# Patient Record
Sex: Male | Born: 1940 | Race: White | Hispanic: No | Marital: Single
Health system: Southern US, Community
[De-identification: ages and names within clinical notes are randomized; demographics above are authoritative.]

## PROBLEM LIST (undated history)

## (undated) DIAGNOSIS — M542 Cervicalgia: Secondary | ICD-10-CM

## (undated) DIAGNOSIS — J449 Chronic obstructive pulmonary disease, unspecified: Secondary | ICD-10-CM

## (undated) DIAGNOSIS — K449 Diaphragmatic hernia without obstruction or gangrene: Secondary | ICD-10-CM

## (undated) DIAGNOSIS — IMO0001 Reserved for inherently not codable concepts without codable children: Secondary | ICD-10-CM

## (undated) DIAGNOSIS — R519 Headache, unspecified: Secondary | ICD-10-CM

## (undated) DIAGNOSIS — E119 Type 2 diabetes mellitus without complications: Secondary | ICD-10-CM

## (undated) DIAGNOSIS — R011 Cardiac murmur, unspecified: Secondary | ICD-10-CM

## (undated) DIAGNOSIS — R0602 Shortness of breath: Secondary | ICD-10-CM

## (undated) DIAGNOSIS — R51 Headache: Secondary | ICD-10-CM

## (undated) DIAGNOSIS — G629 Polyneuropathy, unspecified: Secondary | ICD-10-CM

## (undated) DIAGNOSIS — R12 Heartburn: Secondary | ICD-10-CM

## (undated) DIAGNOSIS — Y249XXA Unspecified firearm discharge, undetermined intent, initial encounter: Secondary | ICD-10-CM

## (undated) DIAGNOSIS — G4730 Sleep apnea, unspecified: Secondary | ICD-10-CM

## (undated) DIAGNOSIS — I10 Essential (primary) hypertension: Secondary | ICD-10-CM

## (undated) DIAGNOSIS — D689 Coagulation defect, unspecified: Secondary | ICD-10-CM

## (undated) DIAGNOSIS — N400 Enlarged prostate without lower urinary tract symptoms: Secondary | ICD-10-CM

## (undated) DIAGNOSIS — I209 Angina pectoris, unspecified: Secondary | ICD-10-CM

## (undated) DIAGNOSIS — E039 Hypothyroidism, unspecified: Secondary | ICD-10-CM

## (undated) DIAGNOSIS — W3400XA Accidental discharge from unspecified firearms or gun, initial encounter: Secondary | ICD-10-CM

## (undated) DIAGNOSIS — M1990 Unspecified osteoarthritis, unspecified site: Secondary | ICD-10-CM

## (undated) DIAGNOSIS — K219 Gastro-esophageal reflux disease without esophagitis: Secondary | ICD-10-CM

## (undated) HISTORY — PX: COLON SURGERY: SHX602

## (undated) HISTORY — PX: CARDIAC CATHETERIZATION: SHX172

## (undated) HISTORY — PX: OTHER SURGICAL HISTORY: SHX169

## (undated) HISTORY — PX: HERNIA REPAIR: SHX51

---

## 2006-12-09 ENCOUNTER — Inpatient Hospital Stay: Payer: Self-pay | Admitting: *Deleted

## 2006-12-09 ENCOUNTER — Other Ambulatory Visit: Payer: Self-pay

## 2006-12-29 ENCOUNTER — Ambulatory Visit: Payer: Self-pay | Admitting: Anesthesiology

## 2007-01-27 ENCOUNTER — Ambulatory Visit: Payer: Self-pay | Admitting: Pain Medicine

## 2007-02-18 ENCOUNTER — Ambulatory Visit: Payer: Self-pay | Admitting: Pain Medicine

## 2007-03-09 ENCOUNTER — Ambulatory Visit: Payer: Self-pay | Admitting: Physician Assistant

## 2007-04-01 ENCOUNTER — Ambulatory Visit: Payer: Self-pay | Admitting: Pain Medicine

## 2007-04-15 ENCOUNTER — Ambulatory Visit: Payer: Self-pay | Admitting: Physician Assistant

## 2007-04-29 ENCOUNTER — Ambulatory Visit: Payer: Self-pay | Admitting: Pain Medicine

## 2007-05-04 ENCOUNTER — Ambulatory Visit: Payer: Self-pay | Admitting: Family Medicine

## 2007-05-15 ENCOUNTER — Inpatient Hospital Stay: Payer: Self-pay | Admitting: Surgery

## 2007-05-31 ENCOUNTER — Ambulatory Visit: Payer: Self-pay | Admitting: Physician Assistant

## 2007-06-30 ENCOUNTER — Ambulatory Visit: Payer: Self-pay | Admitting: Physician Assistant

## 2007-08-30 ENCOUNTER — Ambulatory Visit: Payer: Self-pay | Admitting: Physician Assistant

## 2007-09-09 ENCOUNTER — Ambulatory Visit: Payer: Self-pay | Admitting: Pain Medicine

## 2007-10-11 ENCOUNTER — Ambulatory Visit: Payer: Self-pay | Admitting: Physician Assistant

## 2007-11-08 ENCOUNTER — Ambulatory Visit: Payer: Self-pay | Admitting: Physician Assistant

## 2007-11-11 ENCOUNTER — Ambulatory Visit: Payer: Self-pay | Admitting: Pain Medicine

## 2007-11-25 ENCOUNTER — Ambulatory Visit: Payer: Self-pay | Admitting: Physician Assistant

## 2007-12-31 ENCOUNTER — Emergency Department: Payer: Self-pay | Admitting: Emergency Medicine

## 2008-05-03 ENCOUNTER — Ambulatory Visit: Payer: Self-pay | Admitting: Gastroenterology

## 2008-07-12 ENCOUNTER — Ambulatory Visit: Payer: Self-pay | Admitting: Urology

## 2008-07-25 ENCOUNTER — Ambulatory Visit: Payer: Self-pay | Admitting: Urology

## 2008-07-30 ENCOUNTER — Inpatient Hospital Stay: Payer: Self-pay | Admitting: Internal Medicine
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
## 2008-12-04 IMAGING — CR DG ABDOMEN 3V
1 series · 4 of 4 positions shown · non-contrast
Comparison: none

REASON FOR EXAM: abd pain, nausea, h/o multiple obstructions; [HOSPITAL]
COMMENTS:

PROCEDURE:     DXR - DXR ABDOMEN 3-WAY (INCL PA CXR)  - May 14, 2007  [DATE]
RESULT:     Comparison: Chest radiographs on 05/04/2007.

[Series 1: view not recorded · 0.17mm/px · 4 of 4 slices shown]
[im 1/4]
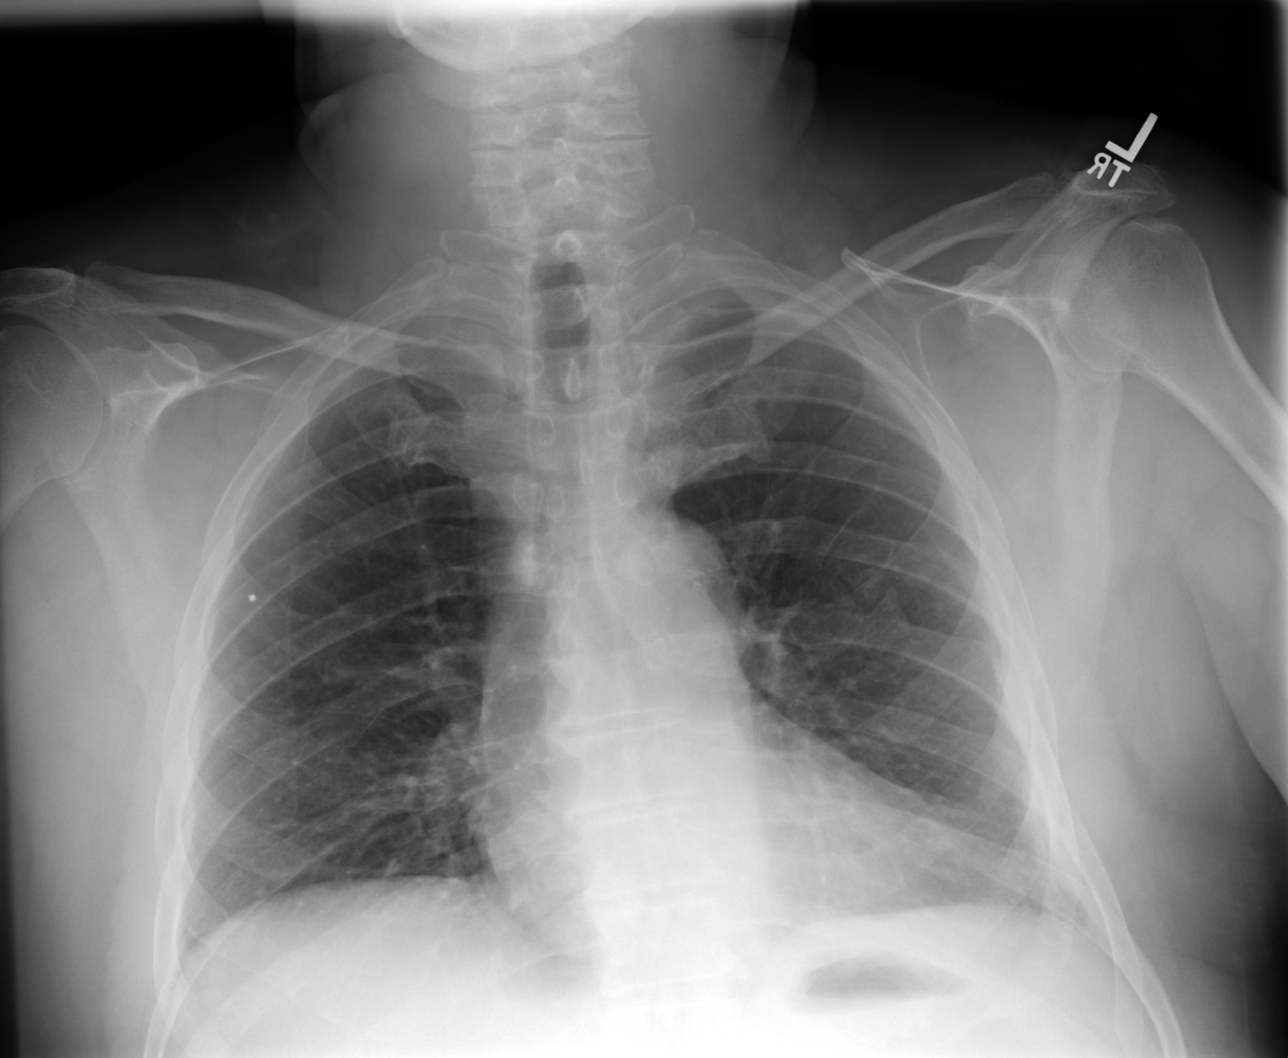
[im 2/4]
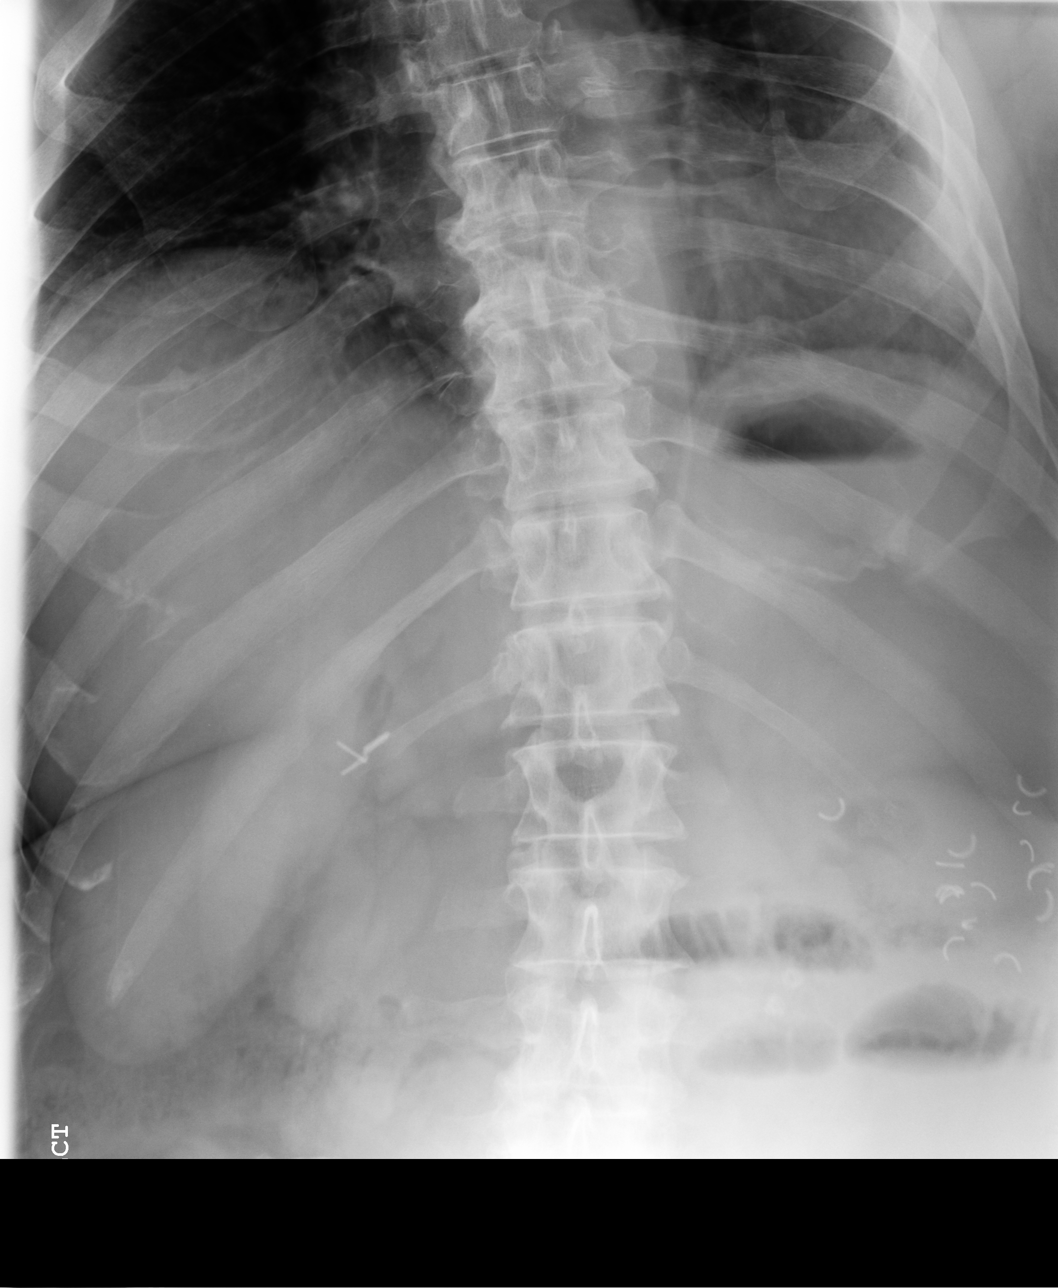
[im 3/4]
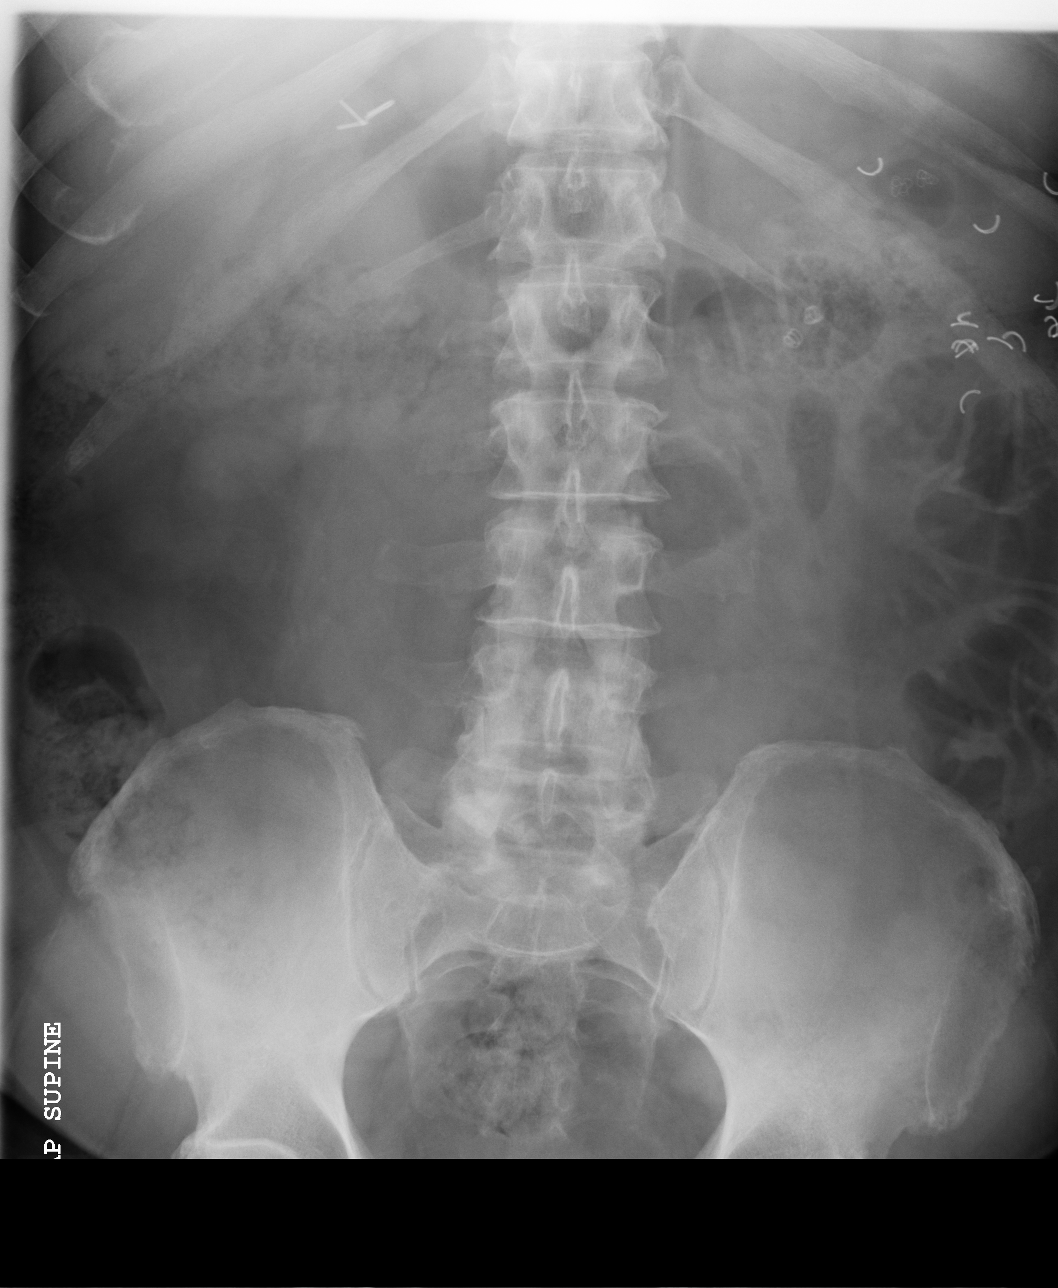
[im 4/4]
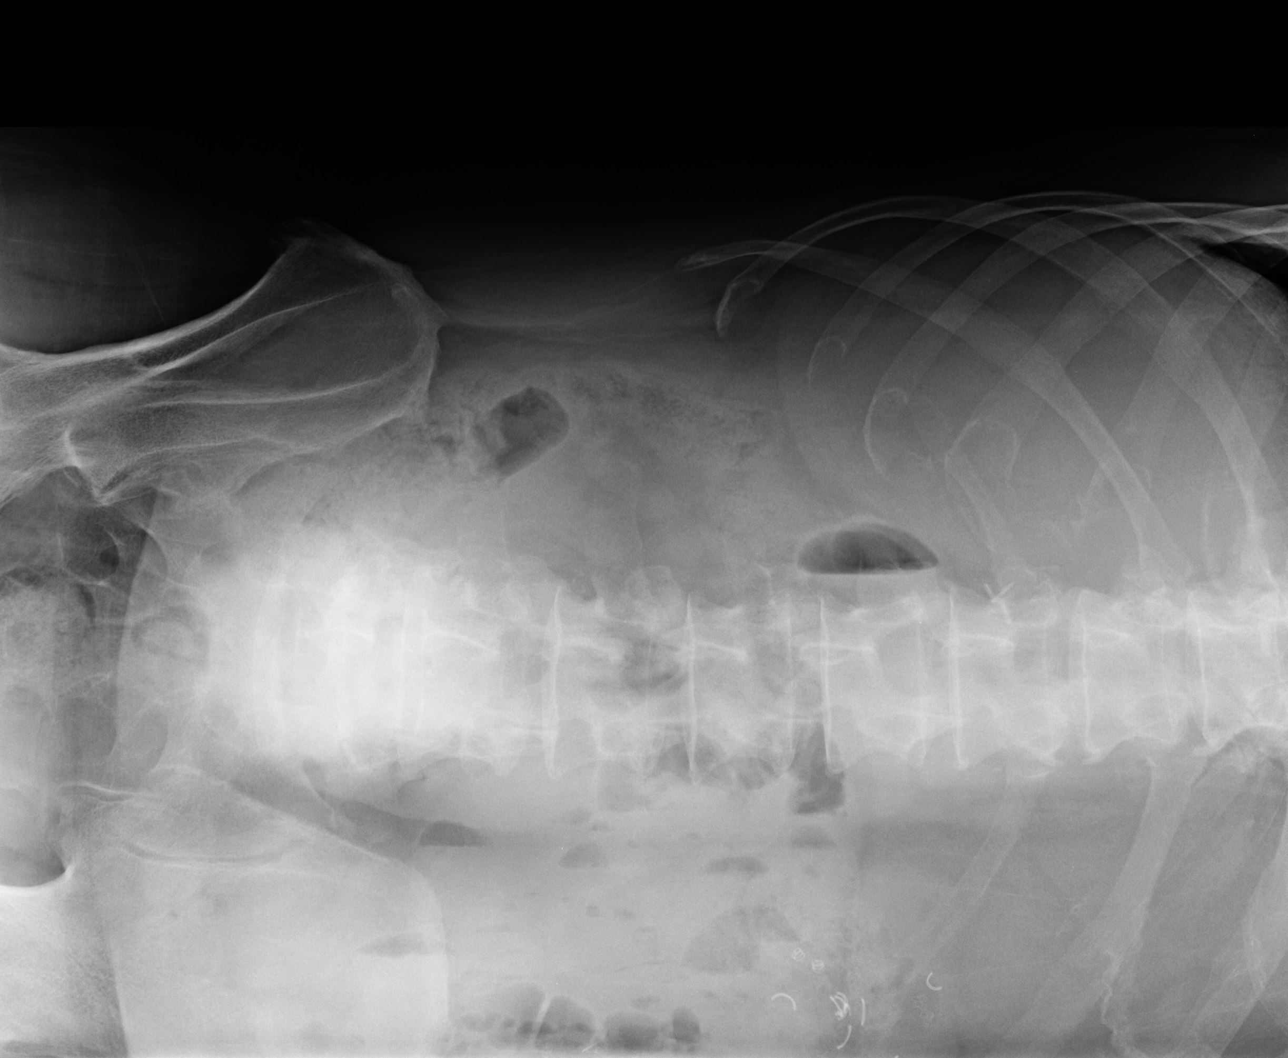

[4 of 4 positions shown; findings below may reference images not displayed]

FINDINGS: Low lung volumes. No significant pulmonary consolidation, pulmonary edema,
pleural effusion, nor pneumothorax. The cardiomediastinal silhouette is
within normal limits. Stable appearance of punctate metallic density
projecting over right upper chest.

Multiple surgical clips are seen projecting over the left upper quadrant.
Cholecystectomy clips are seen the right upper quadrant. There are multiple
loops of mildly dilated small bowel in the left abdomen with multiple
air-fluid levels. Small amount of gas is seen in the colon. Findings are
concerning for small bowel obstruction. There is is no intraperitoneal free
air.
IMPRESSION: 1. Findings are concerning for small bowel obstruction.

## 2008-12-13 ENCOUNTER — Emergency Department: Payer: Self-pay | Admitting: Emergency Medicine

## 2009-04-04 ENCOUNTER — Ambulatory Visit: Payer: Self-pay | Admitting: Gastroenterology

## 2009-06-01 ENCOUNTER — Inpatient Hospital Stay: Payer: Self-pay | Admitting: Internal Medicine

## 2009-06-27 ENCOUNTER — Ambulatory Visit: Payer: Self-pay | Admitting: Ophthalmology

## 2009-08-09 ENCOUNTER — Inpatient Hospital Stay: Payer: Self-pay | Admitting: Student

## 2009-09-26 ENCOUNTER — Emergency Department: Payer: Self-pay | Admitting: Emergency Medicine

## 2009-10-26 ENCOUNTER — Ambulatory Visit: Payer: Self-pay | Admitting: Family Medicine

## 2009-12-01 ENCOUNTER — Emergency Department: Payer: Self-pay | Admitting: Emergency Medicine

## 2009-12-05 ENCOUNTER — Ambulatory Visit: Payer: Self-pay | Admitting: Pain Medicine

## 2010-01-03 ENCOUNTER — Ambulatory Visit: Payer: Self-pay | Admitting: Pain Medicine

## 2010-01-10 ENCOUNTER — Ambulatory Visit: Payer: Self-pay | Admitting: Pain Medicine

## 2010-01-12 ENCOUNTER — Emergency Department: Payer: Self-pay | Admitting: Emergency Medicine

## 2010-01-15 ENCOUNTER — Emergency Department: Payer: Self-pay

## 2010-07-30 ENCOUNTER — Inpatient Hospital Stay: Payer: Self-pay | Admitting: Internal Medicine

## 2010-09-01 ENCOUNTER — Emergency Department: Payer: Self-pay | Admitting: Unknown Physician Specialty

## 2010-12-09 ENCOUNTER — Emergency Department: Payer: Self-pay | Admitting: Unknown Physician Specialty

## 2011-03-03 IMAGING — CR DG ABDOMEN 1V
1 series · 2 of 2 positions shown · non-contrast
Comparison: none

REASON FOR EXAM: SBO
COMMENTS:

[Series 1: view not recorded · 0.17mm/px · 2 of 2 slices shown]
[im 1/2]
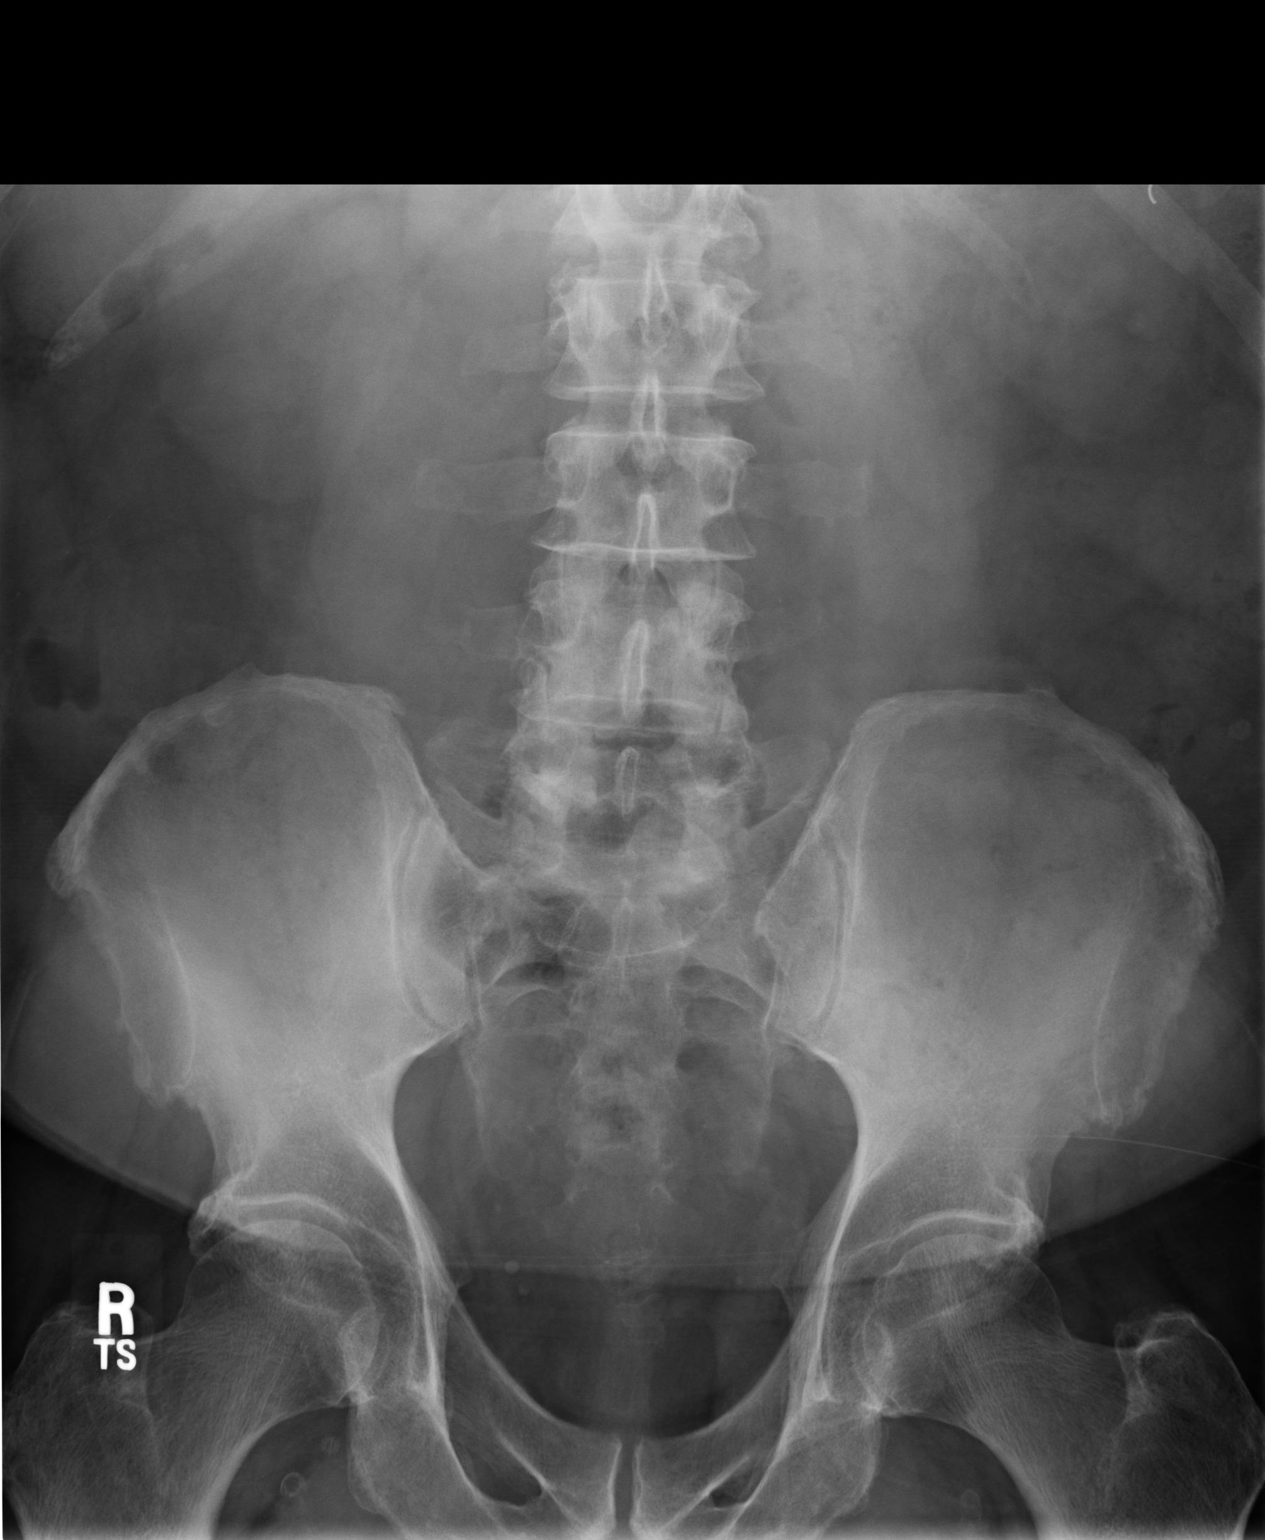
[im 2/2]
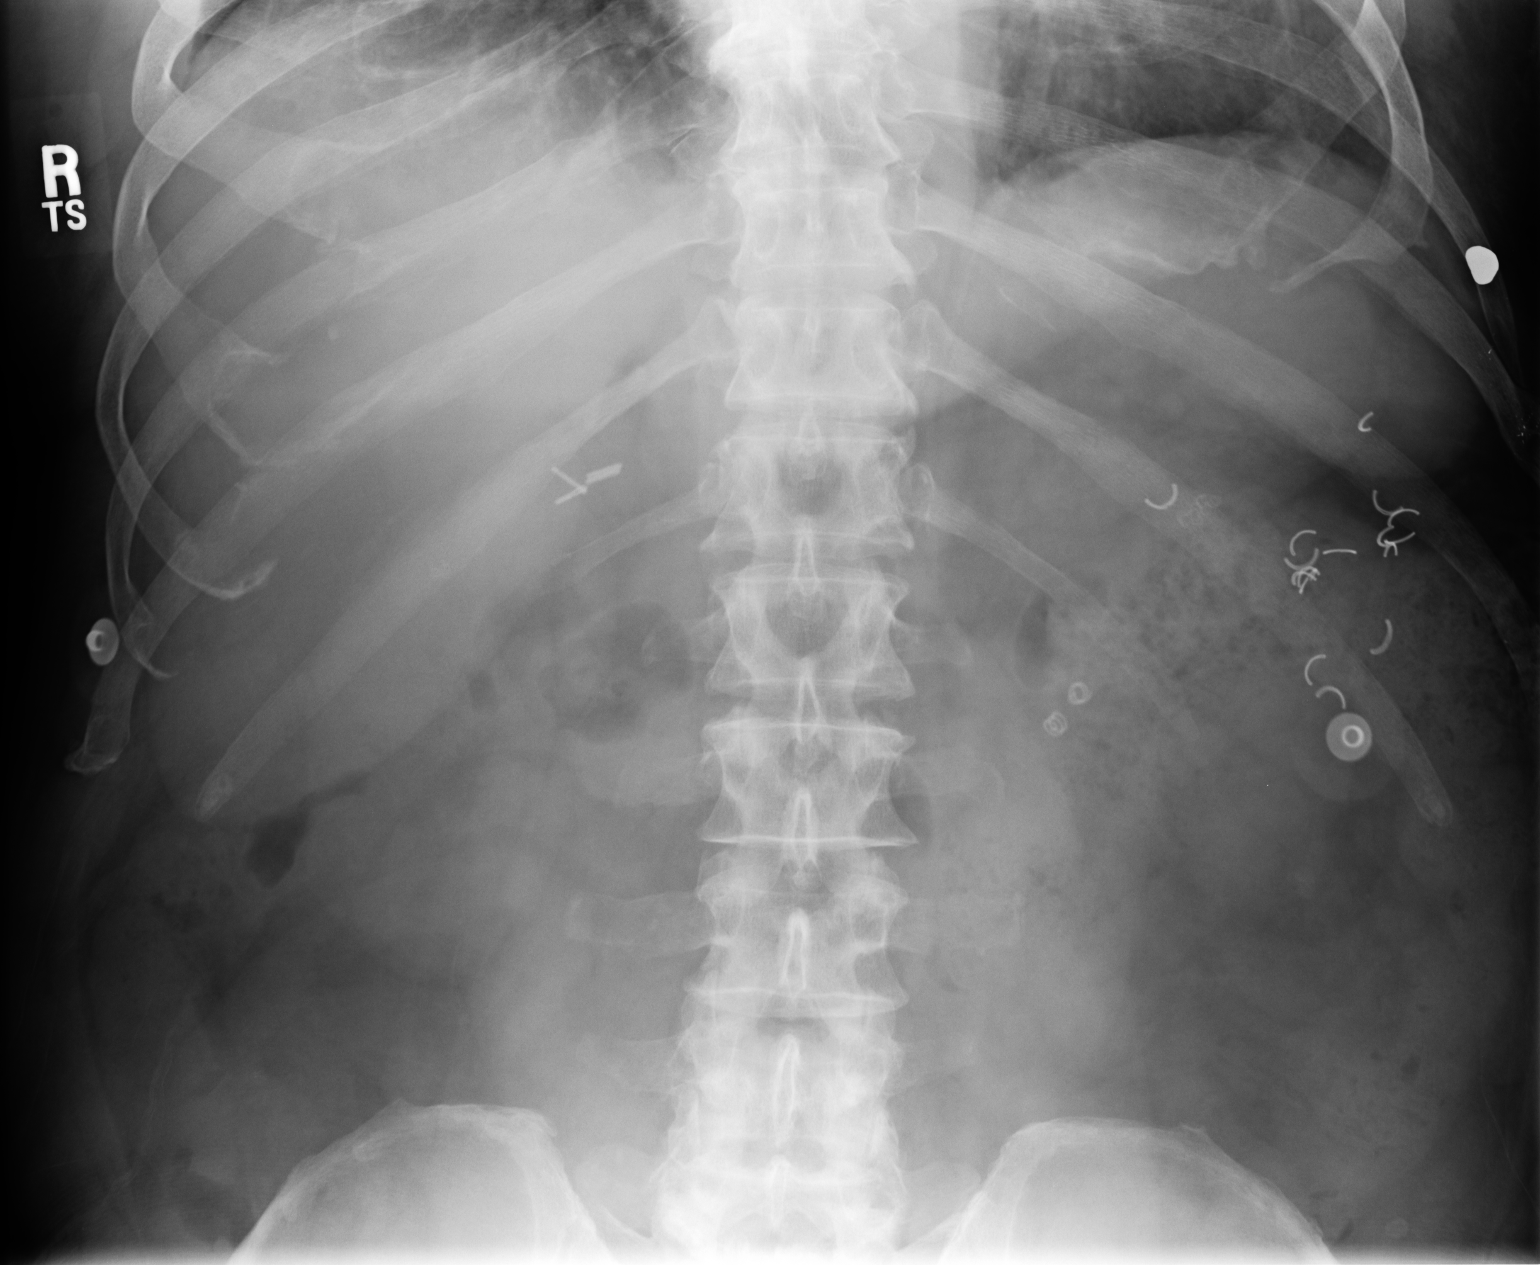

[2 of 2 positions shown; findings below may reference images not displayed]

PROCEDURE:     DXR - DXR KIDNEY URETER BLADDER  - August 10, 2009  [DATE]

RESULT:     The bowel gas pattern is normal. No dilated bowel loops
suspicious for bowel obstruction are seen. No abnormal intraabdominal
calcifications are identified. Postoperative metallic clips are noted in the
region of the gallbladder bed. Postoperative clips are also noted in the
left upper quadrant. No acute bony abnormalities are seen.
IMPRESSION: No acute changes are identified and no significant interval change is seen
as compared to the prior exam of 08/09/2009.

## 2011-03-20 ENCOUNTER — Inpatient Hospital Stay: Payer: Self-pay | Admitting: Internal Medicine

## 2011-06-22 ENCOUNTER — Emergency Department: Payer: Self-pay | Admitting: Emergency Medicine

## 2011-10-31 ENCOUNTER — Ambulatory Visit: Payer: Self-pay | Admitting: Urology

## 2011-10-31 LAB — CBC WITH DIFFERENTIAL/PLATELET
Basophil #: 0.1 10*3/uL (ref 0.0–0.1)
Basophil %: 0.8 %
Eosinophil #: 0.2 10*3/uL (ref 0.0–0.7)
HCT: 43.5 % (ref 40.0–52.0)
Lymphocyte %: 32.4 %
MCHC: 33 g/dL (ref 32.0–36.0)
Monocyte #: 0.7 10*3/uL (ref 0.0–0.7)
Monocyte %: 8.7 %
Neutrophil #: 4.3 10*3/uL (ref 1.4–6.5)
Neutrophil %: 55.8 %
RDW: 14.2 % (ref 11.5–14.5)
WBC: 7.7 10*3/uL (ref 3.8–10.6)

## 2011-10-31 LAB — BASIC METABOLIC PANEL
Anion Gap: 9 (ref 7–16)
BUN: 25 mg/dL — ABNORMAL HIGH (ref 7–18)
Co2: 31 mmol/L (ref 21–32)
Creatinine: 1.62 mg/dL — ABNORMAL HIGH (ref 0.60–1.30)
EGFR (African American): 54 — ABNORMAL LOW
EGFR (Non-African Amer.): 45 — ABNORMAL LOW
Glucose: 108 mg/dL — ABNORMAL HIGH (ref 65–99)
Sodium: 141 mmol/L (ref 136–145)

## 2011-11-04 ENCOUNTER — Ambulatory Visit: Payer: Self-pay | Admitting: Urology

## 2011-11-06 LAB — PATHOLOGY REPORT

## 2012-11-26 LAB — COMPREHENSIVE METABOLIC PANEL
Alkaline Phosphatase: 64 U/L (ref 50–136)
Anion Gap: 6 — ABNORMAL LOW (ref 7–16)
BUN: 18 mg/dL (ref 7–18)
Bilirubin,Total: 0.3 mg/dL (ref 0.2–1.0)
Chloride: 103 mmol/L (ref 98–107)
Creatinine: 1.16 mg/dL (ref 0.60–1.30)
EGFR (African American): >60
EGFR (Non-African Amer.): >60
Glucose: 159 mg/dL — ABNORMAL HIGH (ref 65–99)
Osmolality: 283 (ref 275–301)
Potassium: 4.8 mmol/L (ref 3.5–5.1)
SGOT(AST): 29 U/L (ref 15–37)
Sodium: 139 mmol/L (ref 136–145)
Total Protein: 7.3 g/dL (ref 6.4–8.2)

## 2012-11-26 LAB — PRO B NATRIURETIC PEPTIDE: B-Type Natriuretic Peptide: 20 pg/mL (ref 0–125)

## 2012-11-26 LAB — CBC
HCT: 45.7 % (ref 40.0–52.0)
MCHC: 31.7 g/dL — ABNORMAL LOW (ref 32.0–36.0)
RDW: 14.5 % (ref 11.5–14.5)
WBC: 12 10*3/uL — ABNORMAL HIGH (ref 3.8–10.6)

## 2012-11-26 LAB — TROPONIN I: Troponin-I: <0.02 ng/mL

## 2012-11-26 LAB — CK TOTAL AND CKMB (NOT AT ARMC)
CK, Total: 155 U/L (ref 35–232)
CK-MB: 4.7 ng/mL — ABNORMAL HIGH (ref 0.5–3.6)

## 2012-11-27 ENCOUNTER — Inpatient Hospital Stay: Payer: Self-pay | Admitting: Internal Medicine

## 2012-11-27 LAB — TROPONIN I
Troponin-I: <0.02 ng/mL
Troponin-I: <0.02 ng/mL

## 2013-01-15 ENCOUNTER — Emergency Department: Payer: Self-pay | Admitting: Emergency Medicine

## 2013-01-15 LAB — BASIC METABOLIC PANEL
BUN: 22 mg/dL — ABNORMAL HIGH (ref 7–18)
Calcium, Total: 9 mg/dL (ref 8.5–10.1)
Creatinine: 1.59 mg/dL — ABNORMAL HIGH (ref 0.60–1.30)
EGFR (African American): 49 — ABNORMAL LOW
EGFR (Non-African Amer.): 42 — ABNORMAL LOW
Glucose: 252 mg/dL — ABNORMAL HIGH (ref 65–99)
Potassium: 3.9 mmol/L (ref 3.5–5.1)
Sodium: 138 mmol/L (ref 136–145)

## 2013-01-15 LAB — URINALYSIS, COMPLETE
Bilirubin,UR: NEGATIVE
Glucose,UR: 50 mg/dL (ref 0–75)
Ketone: NEGATIVE
Nitrite: NEGATIVE
Ph: 5 (ref 4.5–8.0)
Protein: NEGATIVE
RBC,UR: 25 /HPF (ref 0–5)
Specific Gravity: 1.01 (ref 1.003–1.030)
WBC UR: 48 /HPF (ref 0–5)

## 2013-01-15 LAB — CBC
HCT: 38.9 % — ABNORMAL LOW (ref 40.0–52.0)
MCH: 29.6 pg (ref 26.0–34.0)
MCV: 89 fL (ref 80–100)
Platelet: 218 10*3/uL (ref 150–440)
RBC: 4.38 10*6/uL — ABNORMAL LOW (ref 4.40–5.90)
RDW: 14.5 % (ref 11.5–14.5)
WBC: 16.9 10*3/uL — ABNORMAL HIGH (ref 3.8–10.6)

## 2013-04-10 ENCOUNTER — Emergency Department: Payer: Self-pay | Admitting: Emergency Medicine

## 2013-04-10 LAB — COMPREHENSIVE METABOLIC PANEL
Anion Gap: 6 — ABNORMAL LOW (ref 7–16)
Chloride: 116 mmol/L — ABNORMAL HIGH (ref 98–107)
Co2: 31 mmol/L (ref 21–32)
Creatinine: 1.57 mg/dL — ABNORMAL HIGH (ref 0.60–1.30)
EGFR (African American): 50 — ABNORMAL LOW
SGOT(AST): 24 U/L (ref 15–37)
SGPT (ALT): 29 U/L (ref 12–78)
Total Protein: 6.9 g/dL (ref 6.4–8.2)

## 2013-04-10 LAB — LIPASE, BLOOD: Lipase: 102 U/L (ref 73–393)

## 2013-04-10 LAB — CBC
HGB: 13.5 g/dL (ref 13.0–18.0)
WBC: 6.8 10*3/uL (ref 3.8–10.6)

## 2013-10-12 LAB — CBC WITH DIFFERENTIAL/PLATELET
BASOS ABS: 0.1 10*3/uL (ref 0.0–0.1)
Basophil %: 0.7 %
EOS ABS: 0.1 10*3/uL (ref 0.0–0.7)
Eosinophil %: 1.3 %
HCT: 49.8 % (ref 40.0–52.0)
HGB: 16 g/dL (ref 13.0–18.0)
LYMPHS ABS: 2.6 10*3/uL (ref 1.0–3.6)
Lymphocyte %: 23.1 %
MCH: 29.1 pg (ref 26.0–34.0)
MCHC: 32.1 g/dL (ref 32.0–36.0)
MCV: 91 fL (ref 80–100)
MONOS PCT: 8.1 %
Monocyte #: 0.9 x10 3/mm (ref 0.2–1.0)
NEUTROS ABS: 7.4 10*3/uL — AB (ref 1.4–6.5)
Neutrophil %: 66.8 %
Platelet: 176 10*3/uL (ref 150–440)
RBC: 5.49 10*6/uL (ref 4.40–5.90)
RDW: 14.8 % — ABNORMAL HIGH (ref 11.5–14.5)
WBC: 11.1 10*3/uL — ABNORMAL HIGH (ref 3.8–10.6)

## 2013-10-12 LAB — COMPREHENSIVE METABOLIC PANEL
ALT: 46 U/L (ref 12–78)
AST: 28 U/L (ref 15–37)
Albumin: 4 g/dL (ref 3.4–5.0)
Alkaline Phosphatase: 63 U/L
Anion Gap: 7 (ref 7–16)
BUN: 19 mg/dL — ABNORMAL HIGH (ref 7–18)
Bilirubin,Total: 0.4 mg/dL (ref 0.2–1.0)
CALCIUM: 10 mg/dL (ref 8.5–10.1)
CREATININE: 1.55 mg/dL — AB (ref 0.60–1.30)
Chloride: 101 mmol/L (ref 98–107)
Co2: 29 mmol/L (ref 21–32)
EGFR (African American): 50 — ABNORMAL LOW
GFR CALC NON AF AMER: 43 — AB
GLUCOSE: 157 mg/dL — AB (ref 65–99)
Osmolality: 279 (ref 275–301)
Potassium: 4.4 mmol/L (ref 3.5–5.1)
Sodium: 137 mmol/L (ref 136–145)
TOTAL PROTEIN: 8.3 g/dL — AB (ref 6.4–8.2)

## 2013-10-12 LAB — LIPASE, BLOOD: LIPASE: 149 U/L (ref 73–393)

## 2013-10-12 LAB — TROPONIN I: Troponin-I: <0.02 ng/mL

## 2013-10-13 ENCOUNTER — Inpatient Hospital Stay: Payer: Self-pay | Admitting: Surgery

## 2013-10-13 LAB — COMPREHENSIVE METABOLIC PANEL
AST: 37 U/L (ref 15–37)
Albumin: 3.5 g/dL (ref 3.4–5.0)
Alkaline Phosphatase: 62 U/L
Anion Gap: 3 — ABNORMAL LOW (ref 7–16)
BUN: 16 mg/dL (ref 7–18)
Bilirubin,Total: 0.3 mg/dL (ref 0.2–1.0)
CHLORIDE: 102 mmol/L (ref 98–107)
Calcium, Total: 9.3 mg/dL (ref 8.5–10.1)
Co2: 34 mmol/L — ABNORMAL HIGH (ref 21–32)
Creatinine: 1.36 mg/dL — ABNORMAL HIGH (ref 0.60–1.30)
EGFR (Non-African Amer.): 51 — ABNORMAL LOW
GFR CALC AF AMER: 59 — AB
Glucose: 109 mg/dL — ABNORMAL HIGH (ref 65–99)
OSMOLALITY: 279 (ref 275–301)
Potassium: 4.7 mmol/L (ref 3.5–5.1)
SGPT (ALT): 40 U/L (ref 12–78)
Sodium: 139 mmol/L (ref 136–145)
Total Protein: 7.1 g/dL (ref 6.4–8.2)

## 2013-10-13 LAB — CBC WITH DIFFERENTIAL/PLATELET
BASOS PCT: 0.4 %
Basophil #: 0 10*3/uL (ref 0.0–0.1)
EOS ABS: 0.1 10*3/uL (ref 0.0–0.7)
Eosinophil %: 0.7 %
HCT: 42.8 % (ref 40.0–52.0)
HGB: 14 g/dL (ref 13.0–18.0)
LYMPHS ABS: 0.9 10*3/uL — AB (ref 1.0–3.6)
Lymphocyte %: 7.8 %
MCH: 29.6 pg (ref 26.0–34.0)
MCHC: 32.7 g/dL (ref 32.0–36.0)
MCV: 91 fL (ref 80–100)
MONO ABS: 0.8 x10 3/mm (ref 0.2–1.0)
Monocyte %: 6.7 %
NEUTROS ABS: 9.8 10*3/uL — AB (ref 1.4–6.5)
Neutrophil %: 84.4 %
Platelet: 178 10*3/uL (ref 150–440)
RBC: 4.72 10*6/uL (ref 4.40–5.90)
RDW: 14.8 % — AB (ref 11.5–14.5)
WBC: 11.6 10*3/uL — ABNORMAL HIGH (ref 3.8–10.6)

## 2013-10-13 LAB — URINALYSIS, COMPLETE
BLOOD: NEGATIVE
Bacteria: NONE SEEN
Bilirubin,UR: NEGATIVE
Glucose,UR: 50 mg/dL (ref 0–75)
Hyaline Cast: 1
Ketone: NEGATIVE
Leukocyte Esterase: NEGATIVE
NITRITE: NEGATIVE
PH: 5 (ref 4.5–8.0)
PROTEIN: NEGATIVE
RBC,UR: <1 /HPF (ref 0–5)
SPECIFIC GRAVITY: 1.03 (ref 1.003–1.030)
Squamous Epithelial: NONE SEEN
WBC UR: 1 /HPF (ref 0–5)

## 2013-10-14 LAB — AMYLASE: AMYLASE: 33 U/L (ref 25–115)

## 2013-10-14 LAB — CBC WITH DIFFERENTIAL/PLATELET
Basophil #: 0 10*3/uL (ref 0.0–0.1)
Basophil %: 0.3 %
Eosinophil #: 0.2 10*3/uL (ref 0.0–0.7)
Eosinophil %: 1.7 %
HCT: 37.7 % — ABNORMAL LOW (ref 40.0–52.0)
HGB: 12.7 g/dL — ABNORMAL LOW (ref 13.0–18.0)
LYMPHS ABS: 1.3 10*3/uL (ref 1.0–3.6)
LYMPHS PCT: 14.8 %
MCH: 30.4 pg (ref 26.0–34.0)
MCHC: 33.5 g/dL (ref 32.0–36.0)
MCV: 91 fL (ref 80–100)
Monocyte #: 1 x10 3/mm (ref 0.2–1.0)
Monocyte %: 11.3 %
Neutrophil #: 6.5 10*3/uL (ref 1.4–6.5)
Neutrophil %: 71.9 %
Platelet: 168 10*3/uL (ref 150–440)
RBC: 4.16 10*6/uL — ABNORMAL LOW (ref 4.40–5.90)
RDW: 14.5 % (ref 11.5–14.5)
WBC: 9 10*3/uL (ref 3.8–10.6)

## 2013-10-14 LAB — COMPREHENSIVE METABOLIC PANEL
ALBUMIN: 2.9 g/dL — AB (ref 3.4–5.0)
ALK PHOS: 54 U/L
ALT: 27 U/L (ref 12–78)
Anion Gap: 2 — ABNORMAL LOW (ref 7–16)
BILIRUBIN TOTAL: 0.5 mg/dL (ref 0.2–1.0)
BUN: 13 mg/dL (ref 7–18)
CO2: 33 mmol/L — AB (ref 21–32)
Calcium, Total: 8.3 mg/dL — ABNORMAL LOW (ref 8.5–10.1)
Chloride: 105 mmol/L (ref 98–107)
Creatinine: 1.31 mg/dL — ABNORMAL HIGH (ref 0.60–1.30)
GFR CALC NON AF AMER: 53 — AB
Glucose: 120 mg/dL — ABNORMAL HIGH (ref 65–99)
Osmolality: 281 (ref 275–301)
POTASSIUM: 4.4 mmol/L (ref 3.5–5.1)
SGOT(AST): 29 U/L (ref 15–37)
SODIUM: 140 mmol/L (ref 136–145)
Total Protein: 6.2 g/dL — ABNORMAL LOW (ref 6.4–8.2)

## 2013-10-14 LAB — MAGNESIUM: Magnesium: 1.5 mg/dL — ABNORMAL LOW

## 2013-10-14 LAB — LIPASE, BLOOD: Lipase: 167 U/L (ref 73–393)

## 2013-10-23 ENCOUNTER — Emergency Department: Payer: Self-pay | Admitting: Emergency Medicine

## 2014-01-08 ENCOUNTER — Emergency Department: Payer: Self-pay | Admitting: Emergency Medicine

## 2014-01-08 LAB — BASIC METABOLIC PANEL
Anion Gap: 4 — ABNORMAL LOW (ref 7–16)
BUN: 28 mg/dL — ABNORMAL HIGH (ref 7–18)
CREATININE: 2.14 mg/dL — AB (ref 0.60–1.30)
Calcium, Total: 9.2 mg/dL (ref 8.5–10.1)
Chloride: 99 mmol/L (ref 98–107)
Co2: 31 mmol/L (ref 21–32)
EGFR (African American): 34 — ABNORMAL LOW
GFR CALC NON AF AMER: 29 — AB
Glucose: 195 mg/dL — ABNORMAL HIGH (ref 65–99)
OSMOLALITY: 279 (ref 275–301)
Potassium: 5.1 mmol/L (ref 3.5–5.1)
Sodium: 134 mmol/L — ABNORMAL LOW (ref 136–145)

## 2014-01-08 LAB — CBC
HCT: 42.4 % (ref 40.0–52.0)
HGB: 13.8 g/dL (ref 13.0–18.0)
MCH: 28.6 pg (ref 26.0–34.0)
MCHC: 32.6 g/dL (ref 32.0–36.0)
MCV: 88 fL (ref 80–100)
Platelet: 165 10*3/uL (ref 150–440)
RBC: 4.83 10*6/uL (ref 4.40–5.90)
RDW: 14.8 % — AB (ref 11.5–14.5)
WBC: 13.7 10*3/uL — ABNORMAL HIGH (ref 3.8–10.6)

## 2014-01-08 LAB — URINALYSIS, COMPLETE
Bilirubin,UR: NEGATIVE
GLUCOSE, UR: NEGATIVE mg/dL (ref 0–75)
Ketone: NEGATIVE
Nitrite: NEGATIVE
Ph: 5 (ref 4.5–8.0)
Protein: 100
RBC,UR: 967 /HPF (ref 0–5)
Specific Gravity: 1.021 (ref 1.003–1.030)
Squamous Epithelial: NONE SEEN
WBC UR: 904 /HPF (ref 0–5)

## 2014-01-10 ENCOUNTER — Inpatient Hospital Stay: Payer: Self-pay | Admitting: Internal Medicine

## 2014-01-10 LAB — URINALYSIS, COMPLETE
BILIRUBIN, UR: NEGATIVE
Glucose,UR: >=500 mg/dL (ref 0–75)
Ketone: NEGATIVE
NITRITE: NEGATIVE
PH: 6 (ref 4.5–8.0)
Protein: 30
Specific Gravity: 1.018 (ref 1.003–1.030)
Squamous Epithelial: NONE SEEN

## 2014-01-10 LAB — BASIC METABOLIC PANEL
ANION GAP: 6 — AB (ref 7–16)
BUN: 33 mg/dL — ABNORMAL HIGH (ref 7–18)
CHLORIDE: 104 mmol/L (ref 98–107)
CO2: 29 mmol/L (ref 21–32)
CREATININE: 2.39 mg/dL — AB (ref 0.60–1.30)
Calcium, Total: 8.7 mg/dL (ref 8.5–10.1)
EGFR (African American): 30 — ABNORMAL LOW
EGFR (Non-African Amer.): 26 — ABNORMAL LOW
Glucose: 245 mg/dL — ABNORMAL HIGH (ref 65–99)
Osmolality: 293 (ref 275–301)
POTASSIUM: 4.6 mmol/L (ref 3.5–5.1)
Sodium: 139 mmol/L (ref 136–145)

## 2014-01-10 LAB — CBC
HCT: 38.9 % — ABNORMAL LOW (ref 40.0–52.0)
HGB: 12.7 g/dL — ABNORMAL LOW (ref 13.0–18.0)
MCH: 28.5 pg (ref 26.0–34.0)
MCHC: 32.5 g/dL (ref 32.0–36.0)
MCV: 88 fL (ref 80–100)
Platelet: 152 10*3/uL (ref 150–440)
RBC: 4.44 10*6/uL (ref 4.40–5.90)
RDW: 14.3 % (ref 11.5–14.5)
WBC: 9.1 10*3/uL (ref 3.8–10.6)

## 2014-01-11 LAB — CBC WITH DIFFERENTIAL/PLATELET
BASOS ABS: 0.1 10*3/uL (ref 0.0–0.1)
BASOS PCT: 1.1 %
EOS ABS: 0.2 10*3/uL (ref 0.0–0.7)
EOS PCT: 3.2 %
HCT: 37.8 % — ABNORMAL LOW (ref 40.0–52.0)
HGB: 12.3 g/dL — AB (ref 13.0–18.0)
LYMPHS ABS: 1.8 10*3/uL (ref 1.0–3.6)
Lymphocyte %: 27.1 %
MCH: 28.9 pg (ref 26.0–34.0)
MCHC: 32.5 g/dL (ref 32.0–36.0)
MCV: 89 fL (ref 80–100)
Monocyte #: 0.8 x10 3/mm (ref 0.2–1.0)
Monocyte %: 11.7 %
NEUTROS ABS: 3.7 10*3/uL (ref 1.4–6.5)
Neutrophil %: 56.9 %
PLATELETS: 168 10*3/uL (ref 150–440)
RBC: 4.25 10*6/uL — AB (ref 4.40–5.90)
RDW: 14.6 % — ABNORMAL HIGH (ref 11.5–14.5)
WBC: 6.6 10*3/uL (ref 3.8–10.6)

## 2014-01-11 LAB — BASIC METABOLIC PANEL
Anion Gap: 4 — ABNORMAL LOW (ref 7–16)
BUN: 21 mg/dL — ABNORMAL HIGH (ref 7–18)
CO2: 31 mmol/L (ref 21–32)
CREATININE: 1.47 mg/dL — AB (ref 0.60–1.30)
Calcium, Total: 9.3 mg/dL (ref 8.5–10.1)
Chloride: 103 mmol/L (ref 98–107)
EGFR (African American): 53 — ABNORMAL LOW
EGFR (Non-African Amer.): 46 — ABNORMAL LOW
GLUCOSE: 175 mg/dL — AB (ref 65–99)
Osmolality: 283 (ref 275–301)
Potassium: 4.9 mmol/L (ref 3.5–5.1)
SODIUM: 138 mmol/L (ref 136–145)

## 2014-01-11 LAB — MAGNESIUM: Magnesium: 1.6 mg/dL — ABNORMAL LOW

## 2014-01-11 LAB — URINE CULTURE

## 2014-02-20 ENCOUNTER — Emergency Department: Payer: Self-pay | Admitting: Emergency Medicine

## 2014-02-20 LAB — HEPATIC FUNCTION PANEL A (ARMC)
AST: 24 U/L (ref 15–37)
Albumin: 3.4 g/dL (ref 3.4–5.0)
Alkaline Phosphatase: 62 U/L
BILIRUBIN TOTAL: 0.5 mg/dL (ref 0.2–1.0)
Bilirubin, Direct: <0.1 mg/dL (ref 0.00–0.20)
SGPT (ALT): 22 U/L (ref 12–78)
TOTAL PROTEIN: 7.4 g/dL (ref 6.4–8.2)

## 2014-02-20 LAB — CBC
HCT: 40.7 % (ref 40.0–52.0)
HGB: 13 g/dL (ref 13.0–18.0)
MCH: 27.8 pg (ref 26.0–34.0)
MCHC: 31.8 g/dL — ABNORMAL LOW (ref 32.0–36.0)
MCV: 88 fL (ref 80–100)
Platelet: 212 10*3/uL (ref 150–440)
RBC: 4.65 10*6/uL (ref 4.40–5.90)
RDW: 15.6 % — AB (ref 11.5–14.5)
WBC: 13.9 10*3/uL — AB (ref 3.8–10.6)

## 2014-02-20 LAB — BASIC METABOLIC PANEL
ANION GAP: 11 (ref 7–16)
BUN: 24 mg/dL — AB (ref 7–18)
CALCIUM: 9 mg/dL (ref 8.5–10.1)
CHLORIDE: 97 mmol/L — AB (ref 98–107)
CO2: 28 mmol/L (ref 21–32)
CREATININE: 1.53 mg/dL — AB (ref 0.60–1.30)
EGFR (African American): 51 — ABNORMAL LOW
EGFR (Non-African Amer.): 44 — ABNORMAL LOW
Glucose: 322 mg/dL — ABNORMAL HIGH (ref 65–99)
Osmolality: 288 (ref 275–301)
POTASSIUM: 4.3 mmol/L (ref 3.5–5.1)
Sodium: 136 mmol/L (ref 136–145)

## 2014-02-20 LAB — TROPONIN I: Troponin-I: <0.02 ng/mL
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
## 2014-12-15 NOTE — Discharge Summary (Signed)
PATIENT NAME:  Douglas Mejia, Sheffield M MR#:  [id number] DATE OF BIRTH:  August 21, 1941  DATE OF ADMISSION:  11/27/2012 DATE OF DISCHARGE:  11/28/2012  ADMISSION DIAGNOSIS: Chronic obstructive pulmonary disease exacerbation.   DISCHARGE DIAGNOSES:  1. Acute on chronic respiratory failure.  2. Suspected chronic aspiration.  No obvious pneumonia but atelectasis on chest x-ray.  3. Diabetes mellitus, insulin-dependent. 4. Obstructive sleep apnea, not on CPAP but on home oxygen at 3 liters of oxygen through nasal cannula.  5. Leukocytosis.  6. Nausea.  7. Chronic abdominal pain.  8. History of gastroesophageal reflux disease.  9. Gastroparesis. 10. Hypertension.  11. Hyperlipidemia.  12. Benign prostatic hypertrophy.  13. Diabetic neuropathy.  14. Lower extremity swelling.  15. History of pulmonary hypertension, not noted on current echocardiogram. 16. History of chronic obstructive pulmonary disease. 17. Hypothyroidism. 18. Chronic kidney disease.   DISCHARGE CONDITION: Stable.   DISCHARGE MEDICATIONS:  1. Resume Duo-Neb 0.5/2.5 in 3 mL inhalation solution every 6 hours as needed.  2. Reglan 10 mg 3 times daily before meals.  3. Enalapril 20 mg p.o. twice daily.  4. Lovaza 1 gram twice daily.  5. Metformin 1 gram twice daily.  6. Multivitamins with minerals once daily.  7. Gabapentin 300 mg 3 times daily.  8. Nexium 40 mg p.o. twice daily.  9. Levothyroxine 25 mcg p.o. daily.  10. Niaspan ER 500 mg p.o. at bedtime.  11. Aspirin 81 mg p.o. daily.  12. Atorvastatin 80 mg p.o. in the morning.  13. Furosemide 40 mg 2 tablets once daily.  14. Lyrica 100 mg p.o. 3 times daily.  15. Klor-Con 20 mEq p.o. once daily.  16. Fenofibrate 48 mg p.o. daily.  17. Tamsulosin 0.4 mg p.o. daily. 18. Advair Diskus 250/50, 1 puff twice daily as needed.  19. Spiriva 18 mcg once daily as needed.  20. Oxygen at 3 liters of oxygen through nasal cannula 24/7.  21. Novolin 70/30, 85 units subcutaneously  twice daily.  22. Amlodipine 2.5 mg p.o. daily.  23. Myrbetriq 50 mg p.o. once daily.  24. Prednisone taper at 50 mg p.o. once on 11/29/2012, then taper x 10 mg daily until stopped.   HOME OXYGEN: At 3 liters of oxygen through nasal cannula 24/7. The patient is to be evaluated for portable home system.  DIET: 2 grams salt, low fat, low cholesterol, carbohydrate-controlled diet. The patient was advised to eat small frequent meals, regular consistency.   ACTIVITY LIMITATIONS: As tolerated.   FOLLOW-UP APPOINTMENT:   1. With [facility] in 2 days after discharge.  2. Dr. Darreld McleanLinda Miles in 2 days after discharge.  3. [facility] versus Dr. Meredeth IdeFleming in 1 week after discharge.    CONSULTANTS: Dr. Meredeth IdeFleming, care management.   LABORATORY AND RADIOLOGICAL DATA:  Chest x-ray, portable single view on 11/26/2012, revealed mild pulmonary hypoinflation.  There are coarse lung markings in the right infrahilar region which may reflect subsegmental atelectasis. Follow-up PA and lateral would be of value when the patient can tolerate this procedure, according to the radiologist. CT scan of abdomen and pelvis without contrast 11/28/2012, revealed study limited without oral or IV contrast material.  No evidence of acute hepatobiliary abnormality. Gallbladder was surgically absent.  There is no acute urinary tract abnormality, no acute bowel abnormality.  There is sigmoid diverticulosis without objective evidence of acute diverticulitis. There is no evidence of intra-abdominal or pelvic lymphadenopathy, or free fluid, or abscess formation.  There is atelectasis versus scarring in the posterior costophrenic gutter on  the left.   The patient's lab data done on the day of admission, showed elevated glucose level of 159, otherwise BMP was unremarkable. The patient's beta-type natriuretic peptide was normal at 20. The patient's liver enzymes were also normal. Cardiac enzymes x 3 were within normal limits. The patient's white  blood cell count was mildly elevated to 12.0, hemoglobin was 14.5, platelet count 240.  Blood cultures taken on 11/26/2012 in the evening showed no growth. ABGs were done on venous blood, showed pH of 7.33, pCO2 was 58.0.  HISTORY AND PHYSICAL: The patient is a 74 year old Caucasian male with past medical history significant for history of gastroesophageal reflux disease, history of gastroparesis, who presented to the hospital with complaints of 3 to 4 weeks of cough and shortness of breath. Please refer to Dr. Riley Nearing admission note on 11/27/2012.   On arrival the hospital, the patient's blood pressure was 145/76, respiration rate was 22, pulse was 79, temperature was 98.4.  Oxygen saturation was 92% on oxygen therapy. Physical exam revealed no rales or wheezing, somewhat diminished breath sounds. Otherwise, physical exam was unremarkable. The patient's abdomen was slightly tender diffusely and scarring was noted as well as hernias were noted.   HOSPITAL COURSE:  The patient admitted to the hospital. He was initiated on antibiotic therapy initially because of concern of bronchitis or pneumonia, as well as steroid therapy and inhalation therapy.  He was also seen by a pulmonologist, Dr. Meredeth Ide.  With this therapy the patient improved; however, further investigating the causes of his dyspnea, it appeared that the patient has been having problems with gastroesophageal reflux disease and questionable aspiration. He was, however, worked up by [facility] , and we did not initiate any other work-up. The patient had an echocardiogram while in the hospital which was clearly within normal limits. The patient was noted to have normal global left ventricular systolic function.  Left ventricular ejection fraction by visual estimation was 50 to 65%.  Left ventricular internal cavity size was normal.  Left ventricular posterior wall thickness was normal.  No left ventricular hypertrophy was noted.  Global left ventricular  systolic function was normal.  Left ventricular ejection fraction, again by visual estimation, was 60 to 65%. Right ventricle was normal.  Global right  ventricular systolic function was hyper-dynamic.  Left atrium revealed normal size. Right atrium was also normal size. There was no evidence of pericardial effusion.  Mitral valve was normal in structure and no evidence of mitral valve regurgitation seen. Tricuspid regurgitation was visualized.  Aortic valve was normal, aortic root was normal in size and structure. There was no sign of pulmonary hypertension during this echo evaluation. It was felt that the patient's dyspnea or acute on chronic respiratory failure with increased need of oxygen could have been related to chronic aspiration. It was felt that the patient would benefit from small frequent meals, also a gastroparesis work-up. He is already on Reglan, so no further studies or other interventions were recommended. The patient is to follow up with [facility] for further recommendations as well as further work-up, if needed. We did not continue the patient on antibiotic therapy due to nonproductive matter of his cough.   We made decision just to continue the patient on steroid taper  as well as inhalation therapy.  The patient was also evaluated for oxygen therapy, and it appeared that he will need to be on oxygen 24/7. He was qualified for oxygen and requires 3 liters of oxygen through nasal cannula  at rest as well as on exertion He is to follow up with Pulmonology in the next 2 days after discharge, and he will be evaluated by an oxygen supply company for a portable system for home.   His vital signs on the day of discharge revealed temperature of 98.3, pulse 85, respiration rate was 20, blood pressure ranging from 107 to 146 systolic and 50s to 60s diastolic. Oxygen saturation was 93% on 2 liters of oxygen through nasal cannula at rest and in the high 80s to 90s on 3 liters of oxygen through nasal cannula  on exertion.   In regards to chronic medical problems such as diabetes, hypertension, hyperlipidemia, gastroesophageal reflux disease, gastroparesis, neuropathy, we did not change any other medications. The patient is being discharged again with the above-mentioned medications and follow-up with his primary care physician, Dr. Darreld Mclean. He is to follow up with [facility] as soon as possible for recent study results which, according to the patient, were not clear and not reported to him since at least 2 or 3 weeks ago. He is also to follow up with pulmonologist of choice, Dr. Meredeth Ide or [facility].   TIME SPENT:  40 minutes.  ____________________________ Katharina Caper, MD rv:cb D: 11/28/2012 18:02:00 ET T: 11/28/2012 21:02:43 ET JOB#: [id number]  cc: Katharina Caper, MD, <Dictator> Leanna Sato, MD Amonda Brillhart MD ELECTRONICALLY SIGNED 12/11/2012 21:05
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
## 2014-12-16 NOTE — Discharge Summary (Signed)
PATIENT NAME:  Douglas Mejia, Douglas Mejia MR#:  [id number] DATE OF BIRTH:  10/19/1940  DATE OF ADMISSION:  01/10/2014 DATE OF DISCHARGE:  01/11/2014  ADMISSION DIAGNOSES: 1.  Acute renal failure.  2.  Urinary tract infection.   DISCHARGE DIAGNOSES: 1.  Acute renal failure.  2.  Enterococcus and Morganella urinary tract infection.  3.  Hypothyroidism. 4.  Sleep apnea.  5.  Hypertension.  6.  Benign prostatic hyperplasia.   CONSULTATIONS:  Nephrology.   PERTINENT LABORATORIES AT DISCHARGE: Renal ultrasound showed prominent bladder wall thickening. No bladder wall pathology bladder malignancy cannot be excluded.   White blood cells 6.6, hemoglobin 12.3, hematocrit 38. Platelets are 168. Sodium 138, potassium 4.9, chloride 103, bicarb 31, BUN 21, creatinine 1.47, glucose 175.   HOSPITAL COURSE:  A 74 year old male with a past medical history of diabetes with retinopathy and neuropathy, BPH, chronic back pain, diabetic gastroparesis, TURP of regrowth tissue, who is here with acute renal failure.  1.  Acute renal failure. The patient presented with acute renal failure. He [facility] and subsequently had it changed, but, in any case, came in with an inability to void. He was noted to have an elevated creatinine. He was also on Bactrim, as well as ACE inhibitor and Lasix. All of these medications are on hold. Nephrology was consulted. His renal ultrasound showed no evidence of hydronephrosis. He will be discharged with a Foley catheter with followup with Dr. Achilles Dunkope, and his acute renal failure has improved. 2.  Enterococcus and Morganella urinary tract infection, both sensitive to ciprofloxacin. Initially, he was put on Rocephin.  3.  Hypothyroidism. The patient continues his Synthroid. 4.  Sleep apnea. The patient sleeps with oxygen. Cannot tolerate CPAP.  5.  Hypertension. Initially we were holding ACE inhibitor, but he may resume his ACE inhibitor.  6.  Benign prostatic  hyperplasia on tamsulosin, and will follow up with Dr. Achilles Dunkope.  7.  Diabetes. We will hold metformin now due to his acute kidney injury, and he will continue his Lantus.   DISCHARGE MEDICATIONS: 1.  Lovaza 1000 mg b.i.d.  2.  Synthroid 25 mcg daily.  3.  Atorvastatin 80 mg at bedtime.  4.  Fenofibrate 48 mg daily.  5.  Advair Diskus 250/50 b.i.d.  6.  Spiriva 18 mcg daily.  7.  Niaspan 500 mg daily.  8.  Ammonium lactate topically b.i.d. p.r.n.  9.  Albuterol q.6 hours p.r.n. shortness of breath.  10.  Novolin 70/30, 90 units b.i.d.  11.  Magnesium oxide 400 mg daily.  12.  Lyrica 150 mg t.i.d.  13.  Tamsulosin 0.4 mg b.i.d.  14.  Promethazine 25 mg q.6 hours p.r.n.  15.  Dymista 137/50 b.i.d.  16.  Enalapril 20 mg daily. 17.   Protonix 40 mg daily.  18.  Ciprofloxacin 500 mg q.12 hours x 10 days.   We will stop Lasix and metformin for now.   DISCHARGE TREATMENT:  Foley to leg bag.   DISCHARGE DIET: Low sodium, ADA diet.   DISCHARGE ACTIVITY: As tolerated.   DISCHARGE FOLLOWUP:  The patient will follow up next week with Dr. Achilles Dunkope.   TIME SPENT: 35 minutes.   The patient was medically stable for discharge   ____________________________ Chanoch Mccleery P. Juliene PinaMody, MD spm:dmm D: 01/11/2014 12:44:01 ET T: 01/11/2014 13:11:47 ET JOB#: [id number]  cc: Adron Geisel P. Juliene PinaMody, MD, <Dictator> Madolyn FriezeBrian S. Achilles Dunkope, MD Janyth ContesSITAL P Yarely Bebee MD ELECTRONICALLY SIGNED 01/11/2014 13:49

## 2014-12-17 NOTE — Op Note (Signed)
PATIENT NAME:  Douglas Mejia, Douglas Mejia MR#:  [id number] DATE OF BIRTH:  April 11, 1941  DATE OF PROCEDURE:  11/04/2011  PREOPERATIVE DIAGNOSES:  1. Benign prostatic hypertrophy.  2. Urinary retention.   POSTOPERATIVE DIAGNOSES:  1. Benign prostatic hypertrophy.  2. Urinary retention.   PROCEDURE: Transurethral resection of the prostate.   SURGEON: Assunta GamblesBrian Kameren Baade, Mejia.D.   ANESTHESIA:  Spinal.   INDICATIONS: The patient is a 74 year old white gentleman with a history of prostatic hypertrophy. He underwent a TURP a number of years prior. He has had extensive regrowth tissue. He developed recent recurrent urinary retention. He presents for transurethral resection of the prostate.   DESCRIPTION OF PROCEDURE: After informed consent was obtained, the patient was taken to the operating room and placed in the dorsal lithotomy position under spinal anesthesia with adequate levels. The patient was then prepped and draped in the usual standard fashion. The saline resectoscope sheath was placed under direct vision without difficulty. Upon entering the prostate fossa, a prominent area of the regrowth tissue was noted from the right apical region. Some additional tissue was noted near the bladder neck on the right.  The predominance of tissue was all on the right. The bladder was then inspected in its entirety. Mild inflammatory changes were noted on the bladder base consistent with Foley catheterization. Bilateral ureteral orifices were well visualized with no lesions noted. They were noted to be an adequate distance away from the bladder neck. The bladder neck itself was more than adequately open. No other bladder lesions were appreciated. The resectoscope loop was placed through the sheath. Resection was begun of the right lateral tissue. The left side of the prostate was left untouched. The resection was undertaken of the bladder neck proper. The resection was continued into the deeper portions of the prostate mainly on  the right. Some anterior tissue was also appreciated. This was also resected back to the level of the verumontanum. No significant bleeding was encountered. The area was cauterized utilizing electrocautery. The prostate chips were collected and will be sent for pathology analysis. The bladder was drained. The resectoscope was removed. A 22 French Foley catheter was placed to gravity drainage utilizing a catheter guide. This was placed without difficulty. Minimal bleeding was encountered. The decision was made not to utilize bladder irrigation. The catheter was placed to gravity drainage. The patient was returned to the supine position. He was then taken to the recovery room in stable condition. There were no problems or complications. The patient tolerated the procedure well.     ____________________________ Madolyn FriezeBrian S. Achilles Dunkope, MD bsc:bjt D: 11/04/2011 19:06:43 ET T: 11/05/2011 12:15:05 ET JOB#: [id number]  cc: Madolyn FriezeBrian S. Achilles Dunkope, MD, <Dictator> Madolyn FriezeBRIAN S Early Ord MD ELECTRONICALLY SIGNED 11/10/2011 22:31

## 2014-12-22 ENCOUNTER — Encounter: Payer: Self-pay | Admitting: Anesthesiology

## 2014-12-22 NOTE — Anesthesia Preprocedure Evaluation (Deleted)
Anesthesia Evaluation    Airway        Dental   Pulmonary COPD         Cardiovascular hypertension,     Neuro/Psych    GI/Hepatic   Endo/Other  diabetes  Renal/GU      Musculoskeletal   Abdominal   Peds  Hematology   Anesthesia Other Findings   Reproductive/Obstetrics                             Anesthesia Physical Anesthesia Plan  ASA: IV  Anesthesia Plan: MAC   Post-op Pain Management:    Induction: Intravenous  Airway Management Planned:   Additional Equipment:   Intra-op Plan:   Post-operative Plan:   Informed Consent: I have reviewed the patients History and Physical, chart, labs and discussed the procedure including the risks, benefits and alternatives for the proposed anesthesia with the patient or authorized representative who has indicated his/her understanding and acceptance.     Plan Discussed with: CRNA  Anesthesia Plan Comments:         Anesthesia Quick Evaluation

## 2014-12-26 NOTE — Discharge Instructions (Signed)
General Anesthesia, Care After Refer to this sheet in the next few weeks. These instructions provide you with information on caring for yourself after your procedure. Your health care provider may also give you more specific instructions. Your treatment has been planned according to current medical practices, but problems sometimes occur. Call your health care provider if you have any problems or questions after your procedure. WHAT TO EXPECT AFTER THE PROCEDURE After the procedure, it is typical to experience:  Sleepiness.  Nausea and vomiting. HOME CARE INSTRUCTIONS  For the first 24 hours after general anesthesia:  Have a responsible person with you.  Do not drive a car. If you are alone, do not take public transportation.  Do not drink alcohol.  Do not take medicine that has not been prescribed by your health care provider.  Do not sign important papers or make important decisions.  You may resume a normal diet and activities as directed by your health care provider.  Change bandages (dressings) as directed.  If you have questions or problems that seem related to general anesthesia, call the hospital and ask for the anesthetist or anesthesiologist on call. SEEK MEDICAL CARE IF:  You have nausea and vomiting that continue the day after anesthesia.  You develop a rash. SEEK IMMEDIATE MEDICAL CARE IF:   You have difficulty breathing.  You have chest pain.  You have any allergic problems. Document Released: 11/17/2000 Document Revised: 08/16/2013 Document Reviewed: 02/24/2013 [facility] Patient Information 2015 [place], [place]. This information is not intended to replace advice given to you by your health care provider. Make sure you discuss any questions you have with your health care provider.  Cataract Surgery Care After Refer to this sheet in the next few weeks. These instructions provide you with information on caring for yourself after your procedure. Your caregiver  may also give you more specific instructions. Your treatment has been planned according to current medical practices, but problems sometimes occur. Call your caregiver if you have any problems or questions after your procedure.  HOME CARE INSTRUCTIONS   Avoid strenuous activities as directed by your caregiver.  Ask your caregiver when you can resume driving.  Use eyedrops or other medicines to help healing and control pressure inside your eye as directed by your caregiver.  Only take over-the-counter or prescription medicines for pain, discomfort, or fever as directed by your caregiver.  Do not to touch or rub your eyes.  You may be instructed to use a protective shield during the first few days and nights after surgery. If not, wear sunglasses to protect your eyes. This is to protect the eye from pressure or from being accidentally bumped.  Keep the area around your eye clean and dry. Avoid swimming or allowing water to hit you directly in the face while showering. Keep soap and shampoo out of your eyes.  Do not bend or lift heavy objects. Bending increases pressure in the eye. You can walk, climb stairs, and do light household chores.  Do not put a contact lens into the eye that had surgery until your caregiver says it is okay to do so.  Ask your doctor when you can return to work. This will depend on the kind of work that you do. If you work in a dusty environment, you may be advised to wear protective eyewear for a period of time.  Ask your caregiver when it will be safe to engage in sexual activity.  Continue with your regular eye exams as directed  by your caregiver. What to expect:  It is normal to feel itching and mild discomfort for a few days after cataract surgery. Some fluid discharge is also common, and your eye may be sensitive to light and touch.  After 1 to 2 days, even moderate discomfort should disappear. In most cases, healing will take about 6 weeks.  If you  received an intraocular lens (IOL), you may notice that colors are very bright or have a blue tinge. Also, if you have been in bright sunlight, everything may appear reddish for a few hours. If you see these color tinges, it is because your lens is clear and no longer cloudy. Within a few months after receiving an IOL, these extra colors should go away. When you have healed, you will probably need new glasses. SEEK MEDICAL CARE IF:   You have increased bruising around your eye.  You have discomfort not helped by medicine. SEEK IMMEDIATE MEDICAL CARE IF:   You have a fever.  You have a worsening or sudden vision loss.  You have redness, swelling, or increasing pain in the eye.  You have a thick discharge from the eye that had surgery. MAKE SURE YOU:  Understand these instructions.  Will watch your condition.  Will get help right away if you are not doing well or get worse.  FOLLOW UP APPOINTMENT: Tomorrow, May 5 @ 10:35 am  Document Released: 02/28/2005 Document Revised: 11/03/2011 Document Reviewed: 04/04/2011 [facility] Patient Information 2015 [employer] [place]. This information is not intended to replace advice given to you by your health care provider. Make sure you discuss any questions you have with your health care provider.

## 2014-12-27 ENCOUNTER — Ambulatory Visit: Admission: RE | Admit: 2014-12-27 | Payer: Medicare Other | Source: Ambulatory Visit | Admitting: Ophthalmology

## 2014-12-27 HISTORY — DX: Headache, unspecified: R51.9

## 2014-12-27 HISTORY — DX: Hypothyroidism, unspecified: E03.9

## 2014-12-27 HISTORY — DX: Shortness of breath: R06.02

## 2014-12-27 HISTORY — DX: Cardiac murmur, unspecified: R01.1

## 2014-12-27 HISTORY — DX: Benign prostatic hyperplasia without lower urinary tract symptoms: N40.0

## 2014-12-27 HISTORY — DX: Diaphragmatic hernia without obstruction or gangrene: K44.9

## 2014-12-27 HISTORY — DX: Type 2 diabetes mellitus without complications: E11.9

## 2014-12-27 HISTORY — DX: Coagulation defect, unspecified: D68.9

## 2014-12-27 HISTORY — DX: Essential (primary) hypertension: I10

## 2014-12-27 HISTORY — DX: Angina pectoris, unspecified: I20.9

## 2014-12-27 HISTORY — DX: Cervicalgia: M54.2

## 2014-12-27 HISTORY — DX: Unspecified osteoarthritis, unspecified site: M19.90

## 2014-12-27 HISTORY — DX: Heartburn: R12

## 2014-12-27 HISTORY — DX: Polyneuropathy, unspecified: G62.9

## 2014-12-27 HISTORY — DX: Reserved for inherently not codable concepts without codable children: IMO0001

## 2014-12-27 HISTORY — DX: Accidental discharge from unspecified firearms or gun, initial encounter: W34.00XA

## 2014-12-27 HISTORY — DX: Headache: R51

## 2014-12-27 HISTORY — DX: Chronic obstructive pulmonary disease, unspecified: J44.9

## 2014-12-27 HISTORY — DX: Sleep apnea, unspecified: G47.30

## 2014-12-27 HISTORY — DX: Unspecified firearm discharge, undetermined intent, initial encounter: Y24.9XXA

## 2014-12-27 HISTORY — DX: Gastro-esophageal reflux disease without esophagitis: K21.9

## 2014-12-27 SURGERY — PHACOEMULSIFICATION, CATARACT, WITH IOL INSERTION
Anesthesia: Topical | Laterality: Right

## 2015-06-26 DEATH — deceased
